# Patient Record
Sex: Male | Born: 1975 | Race: White | Hispanic: No | Marital: Single | Smoking: Current every day smoker
Health system: Southern US, Community
[De-identification: ages and names within clinical notes are randomized; demographics above are authoritative.]

---

## 2018-02-25 ENCOUNTER — Encounter (HOSPITAL_COMMUNITY): Payer: Self-pay | Admitting: Emergency Medicine

## 2018-02-25 ENCOUNTER — Emergency Department (HOSPITAL_COMMUNITY)
Admission: EM | Admit: 2018-02-25 | Discharge: 2018-02-26 | Disposition: A | Discharge status: Home / Self Care | Payer: Self-pay | Attending: Emergency Medicine | Admitting: Emergency Medicine

## 2018-02-25 DIAGNOSIS — N4889 Other specified disorders of penis: Secondary | ICD-10-CM | POA: Insufficient documentation

## 2018-02-25 DIAGNOSIS — Z5321 Procedure and treatment not carried out due to patient leaving prior to being seen by health care provider: Secondary | ICD-10-CM | POA: Insufficient documentation

## 2018-02-25 NOTE — ED Triage Notes (Signed)
Pt reports that he thinks he has a kidney stone stuck in his penis. States he is still able to urinate.

## 2018-02-26 ENCOUNTER — Encounter (HOSPITAL_COMMUNITY): Payer: Self-pay | Admitting: Emergency Medicine

## 2018-02-26 ENCOUNTER — Emergency Department (HOSPITAL_COMMUNITY): Payer: Self-pay

## 2018-02-26 ENCOUNTER — Emergency Department (HOSPITAL_COMMUNITY)
Admission: EM | Admit: 2018-02-26 | Discharge: 2018-02-26 | Disposition: A | Discharge status: Home / Self Care | Payer: Self-pay | Attending: Emergency Medicine | Admitting: Emergency Medicine

## 2018-02-26 DIAGNOSIS — N341 Nonspecific urethritis: Secondary | ICD-10-CM | POA: Insufficient documentation

## 2018-02-26 DIAGNOSIS — N342 Other urethritis: Secondary | ICD-10-CM

## 2018-02-26 DIAGNOSIS — F17210 Nicotine dependence, cigarettes, uncomplicated: Secondary | ICD-10-CM | POA: Insufficient documentation

## 2018-02-26 LAB — URINALYSIS, ROUTINE W REFLEX MICROSCOPIC
Bilirubin Urine: NEGATIVE
Glucose, UA: NEGATIVE mg/dL
Hgb urine dipstick: NEGATIVE
Ketones, ur: NEGATIVE mg/dL
Nitrite: NEGATIVE
Protein, ur: NEGATIVE mg/dL
Specific Gravity, Urine: 1.02 (ref 1.005–1.030)
WBC, UA: >50 WBC/hpf — ABNORMAL HIGH (ref 0–5)
pH: 6 (ref 5.0–8.0)

## 2018-02-26 MED ORDER — CEFTRIAXONE SODIUM 250 MG IJ SOLR
250.0000 mg | Freq: Once | INTRAMUSCULAR | Status: AC
  Administered 2018-02-26: 250 mg via INTRAMUSCULAR
  Filled 2018-02-26: qty 250

## 2018-02-26 MED ORDER — DOXYCYCLINE HYCLATE 100 MG PO TABS
100.0000 mg | ORAL_TABLET | Freq: Once | ORAL | Status: AC
  Administered 2018-02-26: 100 mg via ORAL
  Filled 2018-02-26: qty 1

## 2018-02-26 NOTE — ED Triage Notes (Signed)
Pt states he thinks he has a kidney stone stuck in his penis. He states it is stuck 1 inch in. He has been trying to get it out.

## 2018-02-26 NOTE — ED Notes (Signed)
Patient verbalizes understanding of discharge instructions. Opportunity for questioning and answers were provided. Armband removed by staff, pt discharged from ED ambulatory to home.  

## 2018-02-26 NOTE — ED Provider Notes (Signed)
[facility] Provider Note   CSN: [id number] Arrival date & time: 02/26/18  [id number]     History   Chief Complaint Chief Complaint  Patient presents with  . Groin Pain    HPI John Callahan is a 43 y.o. male.  Patient is a 43 year old male who presents with pain in his penis.  He states for the last 3 weeks he has felt pain in his penis with penile discharge.  He thought that he felt a knot in his mid penis that he was able to push forward.  He is concerned that he may have a kidney stone stuck in his penis.  He has some urinary frequency but is able to urinate without too much difficulty.  He denies any pain on urination.  No back pain.  No abdominal pain.  No known history of kidney stones.  He does have some discomfort in both of his testicles and inguinal areas.  No fevers.  No nausea or vomiting.     History reviewed. No pertinent past medical history.  There are no active problems to display for this patient.   History reviewed. No pertinent surgical history.      Home Medications    Prior to Admission medications   Not on File    Family History No family history on file.  Social History Social History   Tobacco Use  . Smoking status: Current Every Day Smoker    Types: Cigarettes  . Smokeless tobacco: Never Used  Substance Use Topics  . Alcohol use: Not on file  . Drug use: Not on file     Allergies   Patient has no known allergies.   Review of Systems Review of Systems  Constitutional: Negative for chills, diaphoresis, fatigue and fever.  HENT: Negative for congestion, rhinorrhea and sneezing.   Eyes: Negative.   Respiratory: Negative for cough, chest tightness and shortness of breath.   Cardiovascular: Negative for chest pain and leg swelling.  Gastrointestinal: Negative for abdominal pain, blood in stool, diarrhea, nausea and vomiting.  Genitourinary: Positive for discharge, penile pain and testicular pain.  Negative for difficulty urinating, flank pain, frequency and hematuria.  Musculoskeletal: Negative for arthralgias and back pain.  Skin: Negative for rash.  Neurological: Negative for dizziness, speech difficulty, weakness, numbness and headaches.     Physical Exam Updated Vital Signs BP 115/77 (BP Location: Right Arm)   Pulse 79   Temp 98.6 F (37 C) (Oral)   Resp 20   SpO2 99%   Physical Exam Constitutional:      Appearance: He is well-developed.  HENT:     Head: Normocephalic and atraumatic.  Eyes:     Pupils: Pupils are equal, round, and reactive to light.  Neck:     Musculoskeletal: Normal range of motion and neck supple.  Cardiovascular:     Rate and Rhythm: Normal rate and regular rhythm.     Heart sounds: Normal heart sounds.  Pulmonary:     Effort: Pulmonary effort is normal. No respiratory distress.     Breath sounds: Normal breath sounds. No wheezing or rales.  Chest:     Chest wall: No tenderness.  Abdominal:     General: Bowel sounds are normal.     Palpations: Abdomen is soft.     Tenderness: There is no abdominal tenderness. There is no guarding or rebound.  Genitourinary:    Comments: Normal-appearing circumcised penis.  There is some mild erythema distally.  No significant  swelling.  No visible discharge.  He has some mild tenderness to both testicles.  There is some mild tenderness in both inguinal is without any evidence of hernias or significant lymphadenopathy. Musculoskeletal: Normal range of motion.  Lymphadenopathy:     Cervical: No cervical adenopathy.  Skin:    General: Skin is warm and dry.     Findings: No rash.  Neurological:     Mental Status: He is alert and oriented to person, place, and time.      ED Treatments / Results  Labs (all labs ordered are listed, but only abnormal results are displayed) Labs Reviewed  URINALYSIS, ROUTINE W REFLEX MICROSCOPIC - Abnormal; Notable for the following components:      Result Value    APPearance CLOUDY (*)    Leukocytes,Ua LARGE (*)    WBC, UA >50 (*)    Bacteria, UA MANY (*)    All other components within normal limits  RPR  HIV ANTIBODY (ROUTINE TESTING W REFLEX)  GC/CHLAMYDIA PROBE AMP ([place]) NOT AT [facility]    EKG None  Radiology US Scrotum W/doppler  Result Date: 02/26/2018 CLINICAL DATA:  BILATERAL testicular pain for 3 weeks EXAM: SCROTAL ULTRASOUND DOPPLER ULTRASOUND OF THE TESTICLES TECHNIQUE: Complete ultrasound examination of the testicles, epididymis, and other scrotal structures was performed. Color and spectral Doppler ultrasound were also utilized to evaluate blood flow to the testicles. COMPARISON:  None FINDINGS: Right testicle Measurements: 3.8 x 1.9 x 2.6 cm. Normal morphology without mass. Scattered microlithiasis. Internal blood flow present on color Doppler imaging. Left testicle Measurements: 3.5 x 1.9 x 2.6 cm. Normal morphology without mass. Scattered microlithiasis. Internal blood flow present on color Doppler imaging, symmetric with RIGHT Right epididymis:  Normal in size and appearance. Left epididymis:  Normal in size and appearance. Hydrocele:  None visualized. Varicocele:  BILATERAL small varicoceles Pulsed Doppler interrogation of both testes demonstrates normal low resistance arterial and venous waveforms bilaterally. IMPRESSION: BILATERAL testicular microlithiasis, nonspecific. Otherwise normal appearing testes and epididymi bilaterally. BILATERAL small varicoceles. Electronically Signed   By: Ulyses Southward M.D.   On: 02/26/2018 14:27    Procedures Procedures (including critical care time)  Medications Ordered in ED Medications  cefTRIAXone (ROCEPHIN) injection 250 mg (has no administration in time range)  doxycycline (VIBRA-TABS) tablet 100 mg (has no administration in time range)     Initial Impression / Assessment and Plan / ED Course  I have reviewed the triage vital signs and the nursing notes.  Pertinent labs & imaging  results that were available during my care of the patient were reviewed by me and considered in my medical decision making (see chart for details).     Patient presents with pain in his penis with penile discharge.  His ultrasound shows no acute abnormality to his testicles.  No torsion or definite epididymitis.  I feel symptoms are most consistent with a urethritis.  He is concerned that he has a stone in his penis.  There is no hematuria.  For like this is unlikely but I did discuss that if it does not improve with antibiotics, he does need to follow-up with urology and may need a scope.  He was given a shot of Rocephin in the ED and will be given a prescription for doxycycline.  He was given a referral to follow-up with urology.  Return precautions were given.  Final Clinical Impressions(s) / ED Diagnoses   Final diagnoses:  Urethritis    ED Discharge Orders  None       Rolan Bucco, MD 02/26/18 1447

## 2018-02-27 LAB — HIV ANTIBODY (ROUTINE TESTING W REFLEX): HIV Screen 4th Generation wRfx: NONREACTIVE

## 2018-02-27 LAB — RPR: RPR Ser Ql: NONREACTIVE

## 2019-06-12 ENCOUNTER — Emergency Department (HOSPITAL_COMMUNITY): Admission: EM | Admit: 2019-06-12 | Payer: Self-pay | Source: Home / Self Care

## 2019-06-12 ENCOUNTER — Emergency Department (HOSPITAL_COMMUNITY)
Admission: EM | Admit: 2019-06-12 | Discharge: 2019-06-12 | Disposition: A | Discharge status: Home / Self Care | Payer: Self-pay | Attending: Emergency Medicine | Admitting: Emergency Medicine

## 2019-06-12 ENCOUNTER — Encounter (HOSPITAL_COMMUNITY): Payer: Self-pay

## 2019-06-12 ENCOUNTER — Other Ambulatory Visit: Payer: Self-pay

## 2019-06-12 DIAGNOSIS — Y9389 Activity, other specified: Secondary | ICD-10-CM | POA: Insufficient documentation

## 2019-06-12 DIAGNOSIS — S0101XA Laceration without foreign body of scalp, initial encounter: Secondary | ICD-10-CM | POA: Insufficient documentation

## 2019-06-12 DIAGNOSIS — Y999 Unspecified external cause status: Secondary | ICD-10-CM | POA: Insufficient documentation

## 2019-06-12 DIAGNOSIS — F17210 Nicotine dependence, cigarettes, uncomplicated: Secondary | ICD-10-CM | POA: Insufficient documentation

## 2019-06-12 DIAGNOSIS — Y9289 Other specified places as the place of occurrence of the external cause: Secondary | ICD-10-CM | POA: Insufficient documentation

## 2019-06-12 DIAGNOSIS — W2209XA Striking against other stationary object, initial encounter: Secondary | ICD-10-CM | POA: Insufficient documentation

## 2019-06-12 NOTE — ED Notes (Signed)
Pt seen during downtime and discharged.

## 2019-06-12 NOTE — ED Provider Notes (Signed)
[place] DEPT Provider Note   CSN: [id number] Arrival date & time: 06/12/19  1312     History Chief Complaint  Patient presents with  . Head Laceration    John Callahan is a 44 y.o. male.  Patient is a 44 year old male presenting with complaints of a head laceration.  He was getting into the work [employer] this morning when he struck his head on the roof of the [employer].  He sustained a 3.5 cm laceration to the right parietal region.  Bleeding controlled with direct pressure.  He denies any loss of consciousness, headache, neck pain.  The history is provided by the patient.  Head Laceration This is a new problem. The current episode started less than 1 hour ago. The problem occurs constantly. The problem has not changed since onset.Pertinent negatives include no headaches. Nothing aggravates the symptoms. Nothing relieves the symptoms. He has tried nothing for the symptoms.       No past medical history on file.  There are no problems to display for this patient.   No past surgical history on file.     No family history on file.  Social History   Tobacco Use  . Smoking status: Current Every Day Smoker    Types: Cigarettes  . Smokeless tobacco: Never Used  Substance Use Topics  . Alcohol use: Not on file  . Drug use: Not on file    Home Medications Prior to Admission medications   Not on File    Allergies    Patient has no known allergies.  Review of Systems   Review of Systems  Neurological: Negative for headaches.  All other systems reviewed and are negative.   Physical Exam Updated Vital Signs BP 109/74   Pulse 78   Temp 98.1 F (36.7 C) (Oral)   Resp 18   SpO2 98%   Physical Exam Vitals and nursing note reviewed.  Constitutional:      General: He is not in acute distress.    Appearance: Normal appearance. He is not ill-appearing.  HENT:     Head: Normocephalic.     Comments: There is a 3.5 cm laceration to the  right parietal region.  There is no palpable skull defect. Eyes:     Extraocular Movements: Extraocular movements intact.     Pupils: Pupils are equal, round, and reactive to light.  Pulmonary:     Effort: Pulmonary effort is normal.  Neurological:     General: No focal deficit present.     Mental Status: He is alert and oriented to person, place, and time. Mental status is at baseline.     Cranial Nerves: No cranial nerve deficit.     ED Results / Procedures / Treatments   Labs (all labs ordered are listed, but only abnormal results are displayed) Labs Reviewed - No data to display  EKG None  Radiology No results found.  Procedures Procedures (including critical care time)  Medications Ordered in ED Medications - No data to display  ED Course  I have reviewed the triage vital signs and the nursing notes.  Pertinent labs & imaging results that were available during my care of the patient were reviewed by me and considered in my medical decision making (see chart for details).    MDM Rules/Calculators/A&P  Patient with scalp laceration after bumping his head getting into the work [employer].  There is no loss of consciousness and he is neurologically intact.  He reports no headache and  I see no indication for imaging studies.  Laceration repaired as below.  Patient to be discharged with as needed return.  LACERATION REPAIR Performed by: Geoffery Lyons Authorized by: Geoffery Lyons Consent: Verbal consent obtained. Risks and benefits: risks, benefits and alternatives were discussed Consent given by: patient Patient identity confirmed: provided demographic data Prepped and Draped in normal sterile fashion Wound explored  Laceration Location: Left parietal scalp  Laceration Length: 3.5 cm  No Foreign Bodies seen or palpated  Anesthesia: local infiltration  Local anesthetic: lidocaine 1% with epinephrine  Anesthetic total: 3 ml  Irrigation method: syringe Amount of  cleaning: standard  Skin closure: Staples  Number of sutures: 4  Technique: Staples  Patient tolerance: Patient tolerated the procedure well with no immediate complications.   Final Clinical Impression(s) / ED Diagnoses Final diagnoses:  None    Rx / DC Orders ED Discharge Orders    None       Geoffery Lyons, MD 06/12/19 [id number]

## 2019-06-12 NOTE — ED Triage Notes (Signed)
Pt reports getting into the care and hitting his head. Pt has head lac to top of head. Bleeding controlled. Denies LOC or blood thinners. 

## 2019-06-12 NOTE — ED Triage Notes (Signed)
Pt reports getting into the care and hitting his head. Pt has head lac to top of head. Bleeding controlled. Denies LOC or blood thinners.

## 2019-10-01 IMAGING — US US SCROTUM W/ DOPPLER COMPLETE
1 series · 14 of 25 positions shown · non-contrast
Comparison: None

CLINICAL DATA: BILATERAL testicular pain for 3 weeks

EXAM:
SCROTAL ULTRASOUND
DOPPLER ULTRASOUND OF THE TESTICLES
TECHNIQUE: Complete ultrasound examination of the testicles, epididymis, and
other scrotal structures was performed. Color and spectral Doppler
ultrasound were also utilized to evaluate blood flow to the
testicles.

[Series 1: us scrotum w/ doppler complete · 0.06mm/px · 14 of 42 slices shown]
[im 1/42]
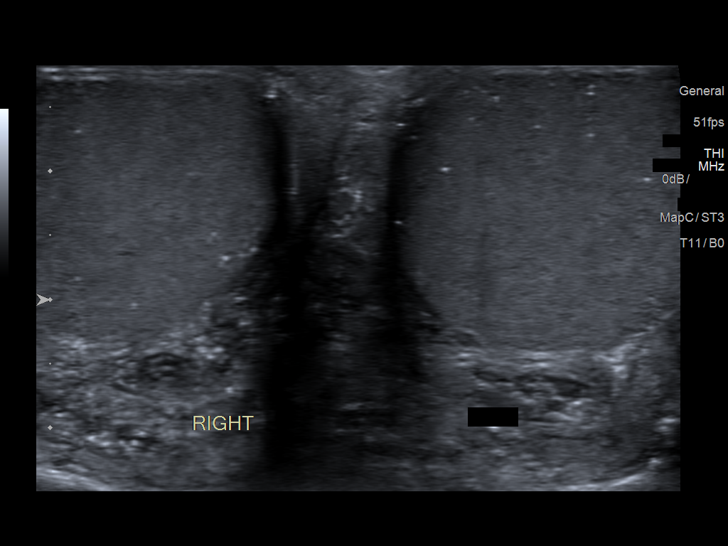
[im 4/42]
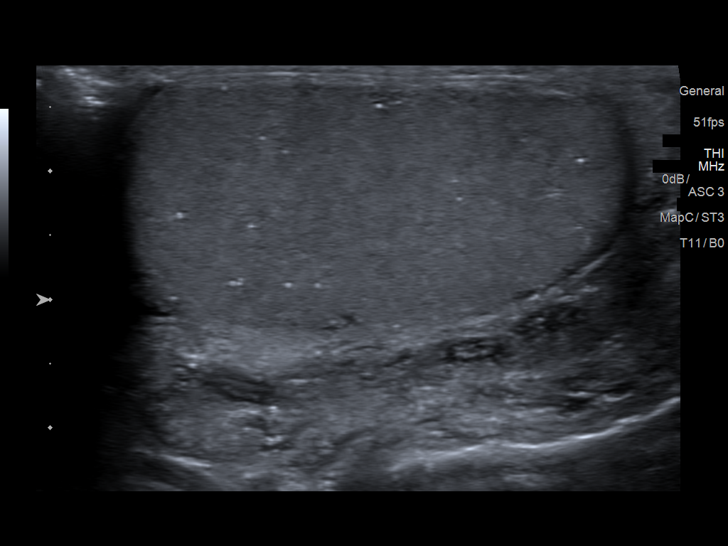
[im 7/42]
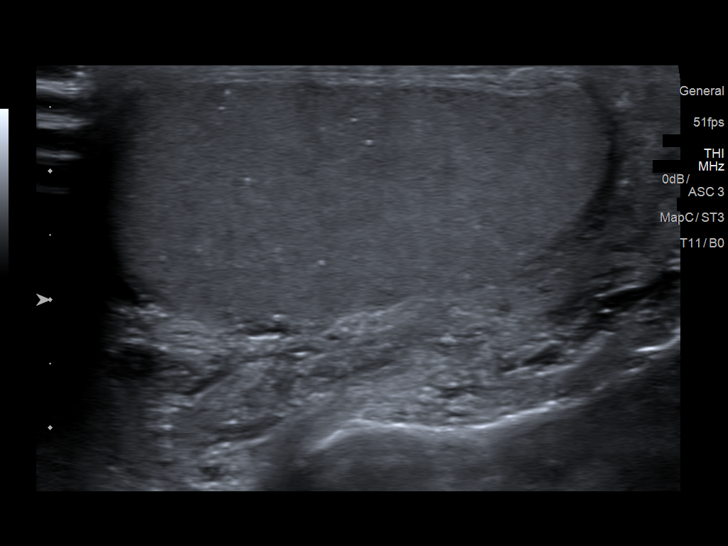
[im 11/42]
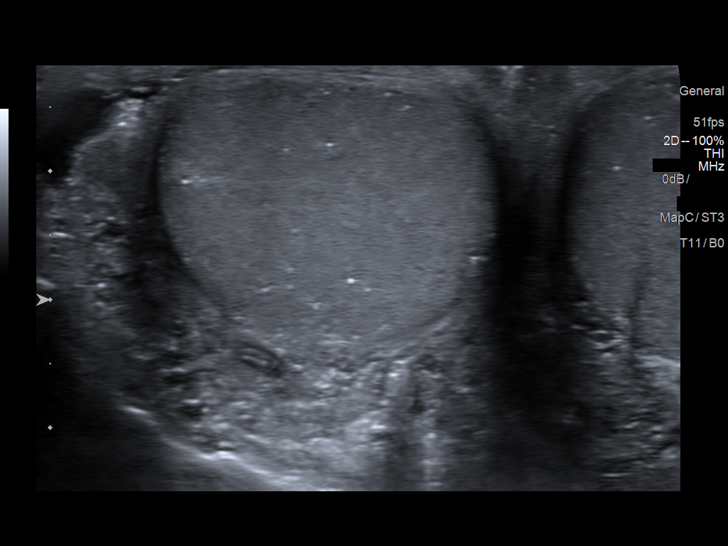
[im 14/42]
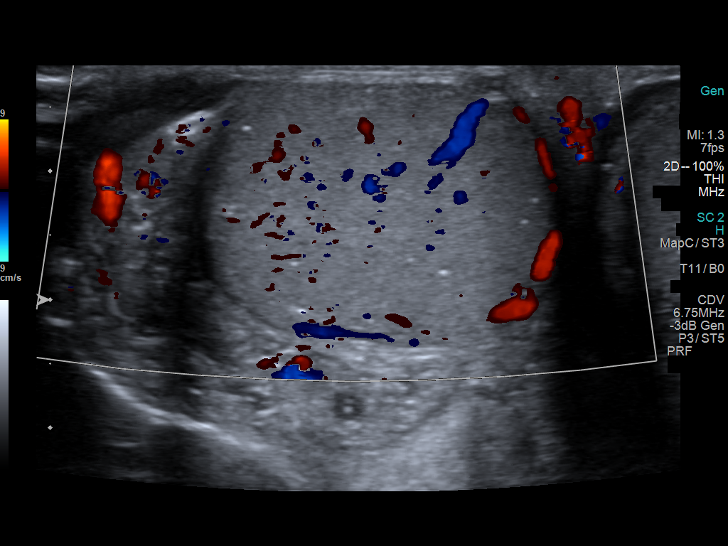
[im 16/42]
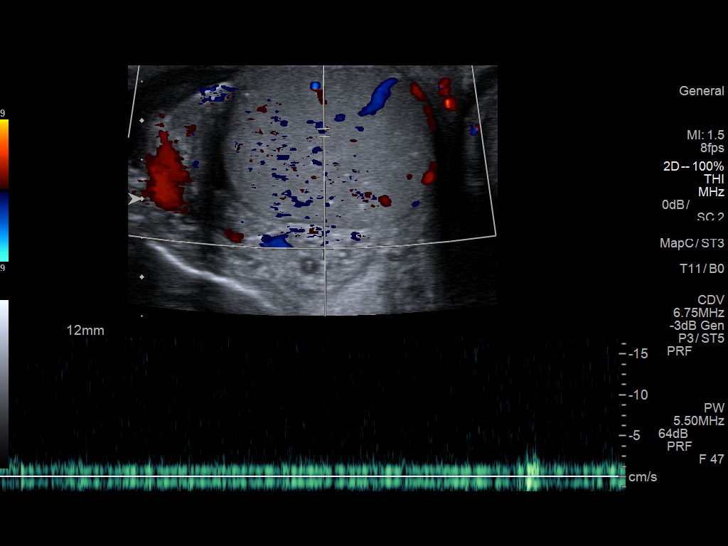
[im 19/42]
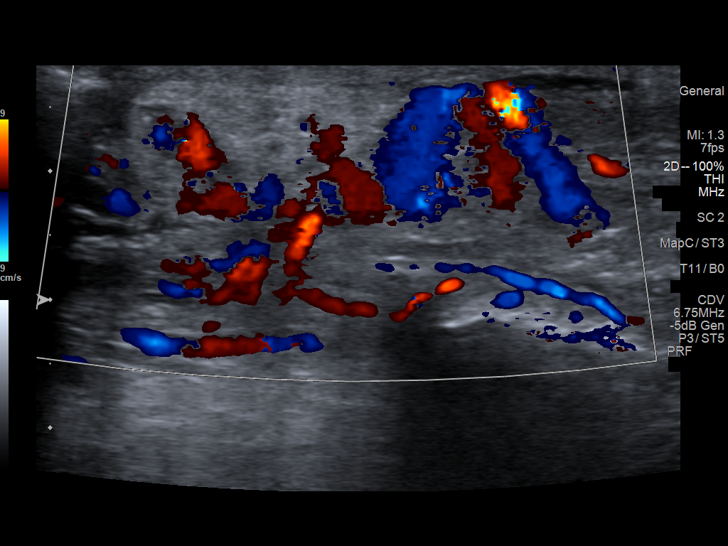
[im 23/42]
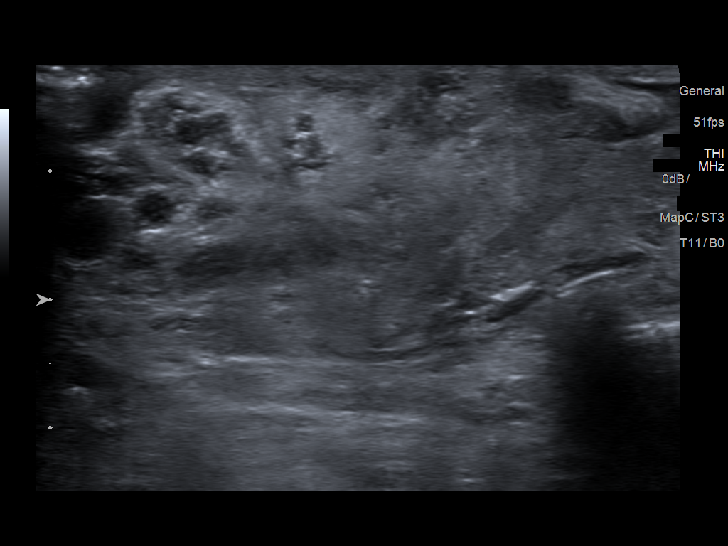
[im 26/42]
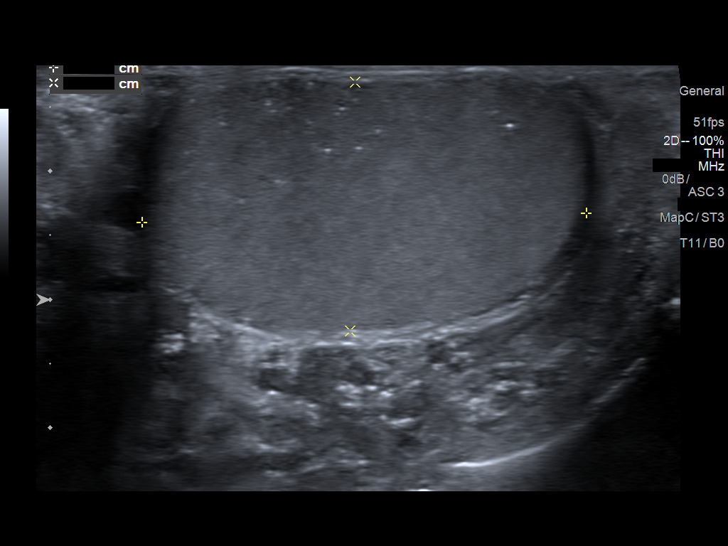
[im 28/42]
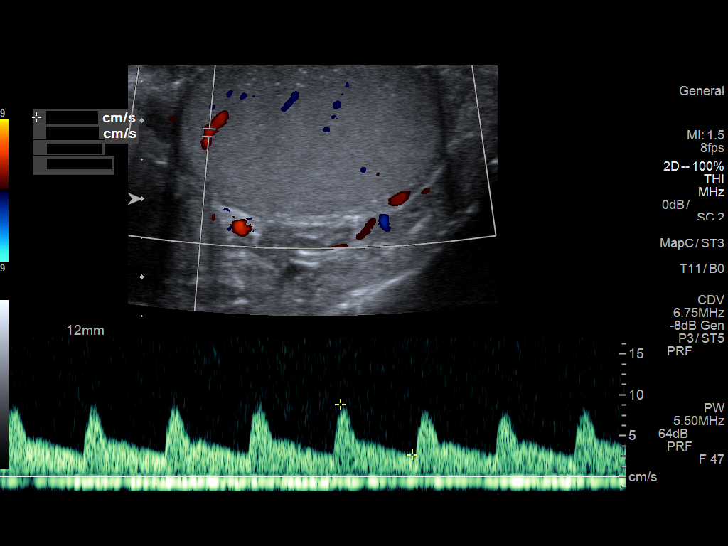
[im 31/42]
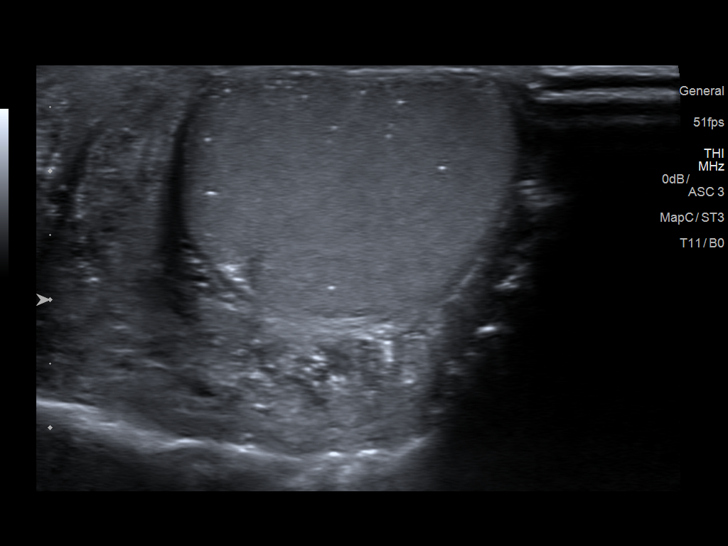
[im 35/42]
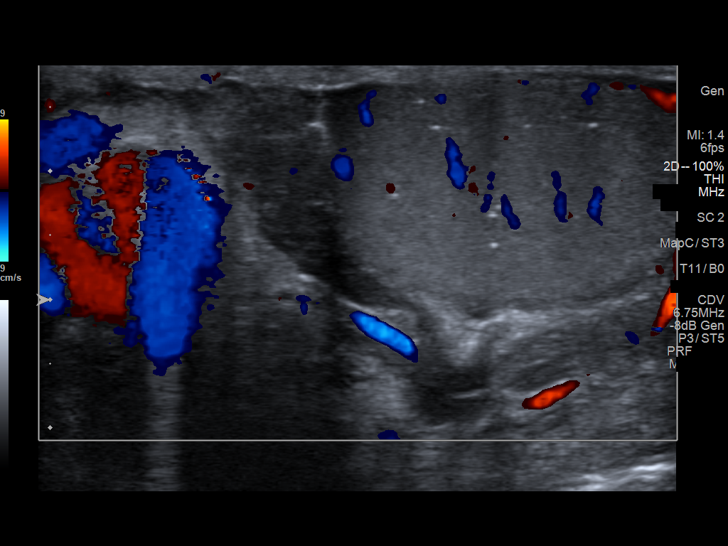
[im 38/42]
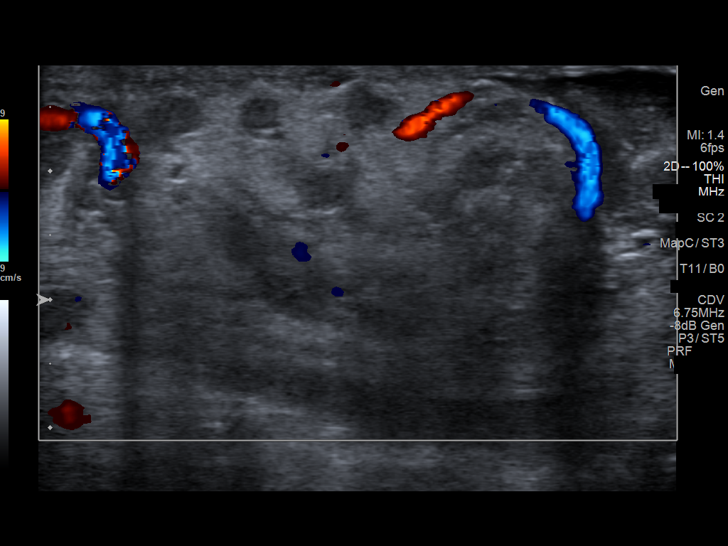
[im 42/42]
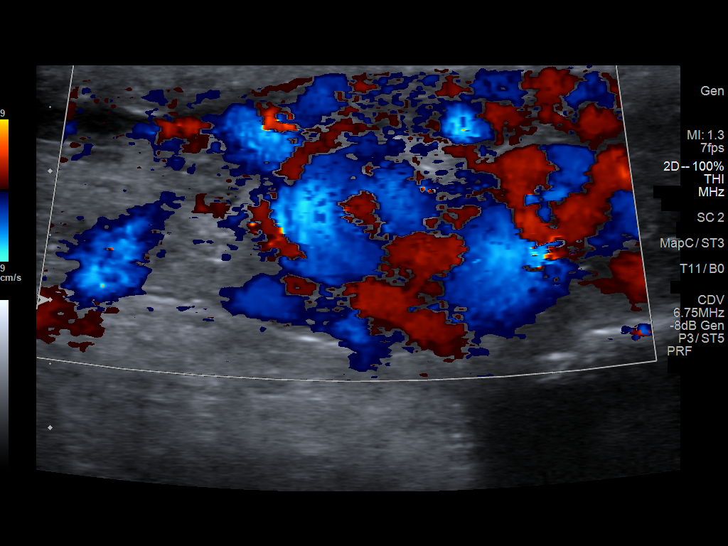

[14 of 25 positions shown; findings below may reference images not displayed]

FINDINGS: Right testicle

Measurements: 3.8 x 1.9 x 2.6 cm. Normal morphology without mass.
Scattered microlithiasis. Internal blood flow present on color
Doppler imaging.

Left testicle

Measurements: 3.5 x 1.9 x 2.6 cm. Normal morphology without mass.
Scattered microlithiasis. Internal blood flow present on color
Doppler imaging, symmetric with RIGHT

Right epididymis:  Normal in size and appearance.

Left epididymis:  Normal in size and appearance.

Hydrocele:  None visualized.

Varicocele:  BILATERAL small varicoceles

Pulsed Doppler interrogation of both testes demonstrates normal low
resistance arterial and venous waveforms bilaterally.
IMPRESSION: BILATERAL testicular microlithiasis, nonspecific.

Otherwise normal appearing testes and epididymi bilaterally.

BILATERAL small varicoceles.
